# Patient Record
Sex: Male | Born: 1975 | Race: White | Hispanic: No | Marital: Married | Smoking: Never smoker
Health system: Southern US, Community
[De-identification: ages and names within clinical notes are randomized; demographics above are authoritative.]

## PROBLEM LIST (undated history)

## (undated) DIAGNOSIS — G4733 Obstructive sleep apnea (adult) (pediatric): Secondary | ICD-10-CM

## (undated) HISTORY — DX: Obstructive sleep apnea (adult) (pediatric): G47.33

---

## 2003-11-25 ENCOUNTER — Ambulatory Visit (HOSPITAL_COMMUNITY): Admission: RE | Admit: 2003-11-25 | Discharge: 2003-11-25 | Payer: Self-pay | Admitting: Internal Medicine

## 2004-11-03 ENCOUNTER — Ambulatory Visit: Payer: Self-pay | Admitting: Internal Medicine

## 2005-03-11 ENCOUNTER — Ambulatory Visit: Payer: Self-pay | Admitting: Internal Medicine

## 2005-04-13 ENCOUNTER — Ambulatory Visit: Payer: Self-pay | Admitting: Internal Medicine

## 2008-06-19 ENCOUNTER — Ambulatory Visit: Payer: Self-pay | Admitting: Internal Medicine

## 2008-06-19 DIAGNOSIS — J45909 Unspecified asthma, uncomplicated: Secondary | ICD-10-CM | POA: Insufficient documentation

## 2008-06-19 DIAGNOSIS — M940 Chondrocostal junction syndrome [Tietze]: Secondary | ICD-10-CM | POA: Insufficient documentation

## 2008-06-20 ENCOUNTER — Ambulatory Visit: Payer: Self-pay | Admitting: Internal Medicine

## 2008-06-25 ENCOUNTER — Encounter (INDEPENDENT_AMBULATORY_CARE_PROVIDER_SITE_OTHER): Payer: Self-pay | Admitting: *Deleted

## 2008-07-01 ENCOUNTER — Encounter (INDEPENDENT_AMBULATORY_CARE_PROVIDER_SITE_OTHER): Payer: Self-pay | Admitting: *Deleted

## 2008-07-01 LAB — CONVERTED CEMR LAB
ALT: 30 units/L (ref 0–53)
Alkaline Phosphatase: 56 units/L (ref 39–117)
Basophils Relative: 0.6 % (ref 0.0–3.0)
Bilirubin, Direct: 0.1 mg/dL (ref 0.0–0.3)
CO2: 27 meq/L (ref 19–32)
Cholesterol: 167 mg/dL (ref 0–200)
Eosinophils Relative: 2.5 % (ref 0.0–5.0)
GFR calc Af Amer: 100 mL/min
GFR calc non Af Amer: 82 mL/min
Glucose, Bld: 88 mg/dL (ref 70–99)
HDL: 33.5 mg/dL — ABNORMAL LOW (ref >39.0)
MCHC: 35.1 g/dL (ref 30.0–36.0)
MCV: 89.2 fL (ref 78.0–100.0)
Neutro Abs: 3.5 10*3/uL (ref 1.4–7.7)
Neutrophils Relative %: 64.4 % (ref 43.0–77.0)
RDW: 11.6 % (ref 11.5–14.6)
TSH: 2.84 microintl units/mL (ref 0.35–5.50)
Total Bilirubin: 0.8 mg/dL (ref 0.3–1.2)
Total Protein: 7 g/dL (ref 6.0–8.3)
WBC: 5.3 10*3/uL (ref 4.5–10.5)

## 2015-04-21 ENCOUNTER — Encounter (HOSPITAL_COMMUNITY): Payer: Self-pay | Admitting: *Deleted

## 2015-04-21 ENCOUNTER — Emergency Department (HOSPITAL_COMMUNITY)
Admission: EM | Admit: 2015-04-21 | Discharge: 2015-04-22 | Disposition: A | Discharge status: Home / Self Care | Payer: BLUE CROSS/BLUE SHIELD | Attending: Emergency Medicine | Admitting: Emergency Medicine

## 2015-04-21 DIAGNOSIS — W1839XA Other fall on same level, initial encounter: Secondary | ICD-10-CM | POA: Diagnosis not present

## 2015-04-21 DIAGNOSIS — S93401A Sprain of unspecified ligament of right ankle, initial encounter: Secondary | ICD-10-CM | POA: Diagnosis not present

## 2015-04-21 DIAGNOSIS — Y9389 Activity, other specified: Secondary | ICD-10-CM | POA: Insufficient documentation

## 2015-04-21 DIAGNOSIS — Y9289 Other specified places as the place of occurrence of the external cause: Secondary | ICD-10-CM | POA: Insufficient documentation

## 2015-04-21 DIAGNOSIS — Y998 Other external cause status: Secondary | ICD-10-CM | POA: Insufficient documentation

## 2015-04-21 DIAGNOSIS — S99911A Unspecified injury of right ankle, initial encounter: Secondary | ICD-10-CM | POA: Diagnosis present

## 2015-04-21 NOTE — ED Notes (Signed)
Pt was playing kickball and rolled his left ankle and heard a pop,  He got up and passed out,  He didn't hit ground his "buddies" caught him,

## 2015-04-22 ENCOUNTER — Emergency Department (HOSPITAL_COMMUNITY): Payer: BLUE CROSS/BLUE SHIELD

## 2015-04-22 ENCOUNTER — Other Ambulatory Visit (HOSPITAL_COMMUNITY): Payer: BLUE CROSS/BLUE SHIELD

## 2015-04-22 MED ORDER — IBUPROFEN 800 MG PO TABS: 800.0000 mg | ORAL_TABLET | Freq: Three times a day (TID) | ORAL | Status: DC

## 2015-04-22 MED ORDER — HYDROCODONE-ACETAMINOPHEN 5-325 MG PO TABS: 1.0000 | ORAL_TABLET | ORAL | Status: DC | PRN

## 2015-04-22 NOTE — ED Provider Notes (Signed)
CSN: [id number]     Arrival date & time 04/21/15  2310 History   First MD Initiated Contact with Patient 04/21/15 2356     Chief Complaint  Patient presents with  . Ankle Injury     (Consider location/radiation/quality/duration/timing/severity/associated sxs/prior Treatment) Patient is a 39 y.o. male presenting with ankle pain. The history is provided by the patient. No language interpreter was used.  Ankle Pain Location:  Ankle Injury: yes   Ankle location:  R ankle Associated symptoms: no back pain, no fever and no neck pain   Associated symptoms comment:  Left ankle injury playing kickball and twisting the ankle while falling. No other injury. He complains of swelling of the lateral ankle and pain with weight bearing.   No past medical history on file. No past surgical history on file. History reviewed. No pertinent family history. Social History  Substance Use Topics  . Smoking status: Never Smoker   . Smokeless tobacco: None  . Alcohol Use: Yes     Comment: occassional     Review of Systems  Constitutional: Negative for fever and chills.  Cardiovascular: Negative.  Negative for chest pain.  Gastrointestinal: Negative.  Negative for abdominal pain.  Musculoskeletal: Negative.  Negative for back pain and neck pain.       See HPI.  Skin: Negative.  Negative for wound.  Neurological: Negative.  Negative for numbness.      Allergies  Review of patient's allergies indicates no known allergies.  Home Medications   Prior to Admission medications   Not on File   BP 115/67 mmHg  Pulse 81  Temp(Src) 98 F (36.7 C) (Oral)  Resp 18  Ht 6' (1.829 m)  Wt 195 lb (88.451 kg)  BMI 26.44 kg/m2  SpO2 99% Physical Exam  Constitutional: He appears well-developed and well-nourished. No distress.  HENT:  Head: Atraumatic.  Neck: Normal range of motion.  Pulmonary/Chest: Effort normal.  Abdominal: Soft. There is no tenderness.  Musculoskeletal:  Left ankle swollen  laterally. No bony deformity. Joint stable. Left foot neurovascularly intact. No calf or lower leg tenderness proximal to ankle.  Neurological: He is alert.  Skin: Skin is warm and dry.  Psychiatric: He has a normal mood and affect.    ED Course  Procedures (including critical care time) Labs Review Labs Reviewed - No data to display  Imaging Review No results found. I have personally reviewed and evaluated these images and lab results as part of my medical decision-making.   EKG Interpretation None      MDM   Final diagnoses:  None    1. Left ankle sprain  Uncomplicated ankle sprain without fracture. ASO/crutches provided.     Charlann Lange, PA-C 04/23/15 0542  Jola Schmidt, MD 04/25/15 [phone]

## 2015-04-22 NOTE — Discharge Instructions (Signed)
Ankle Sprain °An ankle sprain is an injury to the strong, fibrous tissues (ligaments) that hold the bones of your ankle joint together.  °CAUSES °An ankle sprain is usually caused by a fall or by twisting your ankle. Ankle sprains most commonly occur when you step on the outer edge of your foot, and your ankle turns inward. People who participate in sports are more prone to these types of injuries.  °SYMPTOMS  °· Pain in your ankle. The pain may be present at rest or only when you are trying to stand or walk. °· Swelling. °· Bruising. Bruising may develop immediately or within 1 to 2 days after your injury. °· Difficulty standing or walking, particularly when turning corners or changing directions. °DIAGNOSIS  °Your caregiver will ask you details about your injury and perform a physical exam of your ankle to determine if you have an ankle sprain. During the physical exam, your caregiver will press on and apply pressure to specific areas of your foot and ankle. Your caregiver will try to move your ankle in certain ways. An X-ray exam may be done to be sure a bone was not broken or a ligament did not separate from one of the bones in your ankle (avulsion fracture).  °TREATMENT  °Certain types of braces can help stabilize your ankle. Your caregiver can make a recommendation for this. Your caregiver may recommend the use of medicine for pain. If your sprain is severe, your caregiver may refer you to a surgeon who helps to restore function to parts of your skeletal system (orthopedist) or a physical therapist. °HOME CARE INSTRUCTIONS  °· Apply ice to your injury for 1-2 days or as directed by your caregiver. Applying ice helps to reduce inflammation and pain. °· Put ice in a plastic bag. °· Place a towel between your skin and the bag. °· Leave the ice on for 15-20 minutes at a time, every 2 hours while you are awake. °· Only take over-the-counter or prescription medicines for pain, discomfort, or fever as directed by  your caregiver. °· Elevate your injured ankle above the level of your heart as much as possible for 2-3 days. °· If your caregiver recommends crutches, use them as instructed. Gradually put weight on the affected ankle. Continue to use crutches or a cane until you can walk without feeling pain in your ankle. °· If you have a plaster splint, wear the splint as directed by your caregiver. Do not rest it on anything harder than a pillow for the first 24 hours. Do not put weight on it. Do not get it wet. You may take it off to take a shower or bath. °· You may have been given an elastic bandage to wear around your ankle to provide support. If the elastic bandage is too tight (you have numbness or tingling in your foot or your foot becomes cold and blue), adjust the bandage to make it comfortable. °· If you have an air splint, you may blow more air into it or let air out to make it more comfortable. You may take your splint off at night and before taking a shower or bath. Wiggle your toes in the splint several times per day to decrease swelling. °SEEK MEDICAL CARE IF:  °· You have rapidly increasing bruising or swelling. °· Your toes feel extremely cold or you lose feeling in your foot. °· Your pain is not relieved with medicine. °SEEK IMMEDIATE MEDICAL CARE IF: °· Your toes are numb or blue. °·   You have severe pain that is increasing. °MAKE SURE YOU:  °· Understand these instructions. °· Will watch your condition. °· Will get help right away if you are not doing well or get worse. °Document Released: 07/19/2005 Document Revised: 04/12/2012 Document Reviewed: 07/31/2011 °ExitCare® Patient Information ©2015 [employer]. This information is not intended to replace advice given to you by your health care provider. Make sure you discuss any questions you have with your health care provider. °Cryotherapy °Cryotherapy means treatment with cold. Ice or gel packs can be used to reduce both pain and swelling. Ice is the most  helpful within the first 24 to 48 hours after an injury or flare-up from overusing a muscle or joint. Sprains, strains, spasms, burning pain, shooting pain, and aches can all be eased with ice. Ice can also be used when recovering from surgery. Ice is effective, has very few side effects, and is safe for most people to use. °PRECAUTIONS  °Ice is not a safe treatment option for people with: °· Raynaud phenomenon. This is a condition affecting small blood vessels in the extremities. Exposure to cold may cause your problems to return. °· Cold hypersensitivity. There are many forms of cold hypersensitivity, including: °¨ Cold urticaria. Red, itchy hives appear on the skin when the tissues begin to warm after being iced. °¨ Cold erythema. This is a red, itchy rash caused by exposure to cold. °¨ Cold hemoglobinuria. Red blood cells break down when the tissues begin to warm after being iced. The hemoglobin that carry oxygen are passed into the urine because they cannot combine with blood proteins fast enough. °· Numbness or altered sensitivity in the area being iced. °If you have any of the following conditions, do not use ice until you have discussed cryotherapy with your caregiver: °· Heart conditions, such as arrhythmia, angina, or chronic heart disease. °· High blood pressure. °· Healing wounds or open skin in the area being iced. °· Current infections. °· Rheumatoid arthritis. °· Poor circulation. °· Diabetes. °Ice slows the blood flow in the region it is applied. This is beneficial when trying to stop inflamed tissues from spreading irritating chemicals to surrounding tissues. However, if you expose your skin to cold temperatures for too long or without the proper protection, you can damage your skin or nerves. Watch for signs of skin damage due to cold. °HOME CARE INSTRUCTIONS °Follow these tips to use ice and cold packs safely. °· Place a dry or damp towel between the ice and skin. A damp towel will cool the skin  more quickly, so you may need to shorten the time that the ice is used. °· For a more rapid response, add gentle compression to the ice. °· Ice for no more than 10 to 20 minutes at a time. The bonier the area you are icing, the less time it will take to get the benefits of ice. °· Check your skin after 5 minutes to make sure there are no signs of a poor response to cold or skin damage. °· Rest 20 minutes or more between uses. °· Once your skin is numb, you can end your treatment. You can test numbness by very lightly touching your skin. The touch should be so light that you do not see the skin dimple from the pressure of your fingertip. When using ice, most people will feel these normal sensations in this order: cold, burning, aching, and numbness. °· Do not use ice on someone who cannot communicate their responses to pain,   such as small children or people with dementia. °HOW TO MAKE AN ICE PACK °Ice packs are the most common way to use ice therapy. Other methods include ice massage, ice baths, and cryosprays. Muscle creams that cause a cold, tingly feeling do not offer the same benefits that ice offers and should not be used as a substitute unless recommended by your caregiver. °To make an ice pack, do one of the following: °· Place crushed ice or a bag of frozen vegetables in a sealable plastic bag. Squeeze out the excess air. Place this bag inside another plastic bag. Slide the bag into a pillowcase or place a damp towel between your skin and the bag. °· Mix 3 parts water with 1 part rubbing alcohol. Freeze the mixture in a sealable plastic bag. When you remove the mixture from the freezer, it will be slushy. Squeeze out the excess air. Place this bag inside another plastic bag. Slide the bag into a pillowcase or place a damp towel between your skin and the bag. °SEEK MEDICAL CARE IF: °· You develop white spots on your skin. This may give the skin a blotchy (mottled) appearance. °· Your skin turns blue or  pale. °· Your skin becomes waxy or hard. °· Your swelling gets worse. °MAKE SURE YOU:  °· Understand these instructions. °· Will watch your condition. °· Will get help right away if you are not doing well or get worse. °Document Released: 03/15/2011 Document Revised: 12/03/2013 Document Reviewed: 03/15/2011 °ExitCare® Patient Information ©2015 [employer]. This information is not intended to replace advice given to you by your health care provider. Make sure you discuss any questions you have with your health care provider. ° °

## 2015-08-03 DIAGNOSIS — M109 Gout, unspecified: Secondary | ICD-10-CM

## 2015-08-03 HISTORY — DX: Gout, unspecified: M10.9

## 2017-02-16 DIAGNOSIS — M659 Synovitis and tenosynovitis, unspecified: Secondary | ICD-10-CM | POA: Diagnosis not present

## 2017-02-16 DIAGNOSIS — M109 Gout, unspecified: Secondary | ICD-10-CM | POA: Diagnosis not present

## 2017-02-16 DIAGNOSIS — M71571 Other bursitis, not elsewhere classified, right ankle and foot: Secondary | ICD-10-CM | POA: Diagnosis not present

## 2017-02-16 DIAGNOSIS — M19071 Primary osteoarthritis, right ankle and foot: Secondary | ICD-10-CM | POA: Diagnosis not present

## 2017-02-23 DIAGNOSIS — M7751 Other enthesopathy of right foot: Secondary | ICD-10-CM | POA: Diagnosis not present

## 2017-02-23 DIAGNOSIS — M659 Synovitis and tenosynovitis, unspecified: Secondary | ICD-10-CM | POA: Diagnosis not present

## 2017-02-23 DIAGNOSIS — B078 Other viral warts: Secondary | ICD-10-CM | POA: Diagnosis not present

## 2017-03-07 DIAGNOSIS — L237 Allergic contact dermatitis due to plants, except food: Secondary | ICD-10-CM | POA: Diagnosis not present

## 2017-03-17 DIAGNOSIS — H16201 Unspecified keratoconjunctivitis, right eye: Secondary | ICD-10-CM | POA: Diagnosis not present

## 2017-04-26 DIAGNOSIS — M7751 Other enthesopathy of right foot: Secondary | ICD-10-CM | POA: Diagnosis not present

## 2017-04-26 DIAGNOSIS — M659 Synovitis and tenosynovitis, unspecified: Secondary | ICD-10-CM | POA: Diagnosis not present

## 2017-04-26 DIAGNOSIS — M109 Gout, unspecified: Secondary | ICD-10-CM | POA: Diagnosis not present

## 2017-05-11 DIAGNOSIS — D2230 Melanocytic nevi of unspecified part of face: Secondary | ICD-10-CM | POA: Diagnosis not present

## 2017-05-11 DIAGNOSIS — L814 Other melanin hyperpigmentation: Secondary | ICD-10-CM | POA: Diagnosis not present

## 2017-05-11 DIAGNOSIS — B079 Viral wart, unspecified: Secondary | ICD-10-CM | POA: Diagnosis not present

## 2017-05-11 DIAGNOSIS — Z85828 Personal history of other malignant neoplasm of skin: Secondary | ICD-10-CM | POA: Diagnosis not present

## 2017-05-18 DIAGNOSIS — Z0000 Encounter for general adult medical examination without abnormal findings: Secondary | ICD-10-CM | POA: Diagnosis not present

## 2017-05-25 DIAGNOSIS — Z8709 Personal history of other diseases of the respiratory system: Secondary | ICD-10-CM | POA: Diagnosis not present

## 2017-08-05 DIAGNOSIS — J45909 Unspecified asthma, uncomplicated: Secondary | ICD-10-CM | POA: Diagnosis not present

## 2017-09-26 DIAGNOSIS — E781 Pure hyperglyceridemia: Secondary | ICD-10-CM | POA: Diagnosis not present

## 2017-10-03 DIAGNOSIS — J45909 Unspecified asthma, uncomplicated: Secondary | ICD-10-CM | POA: Diagnosis not present

## 2017-10-03 DIAGNOSIS — E781 Pure hyperglyceridemia: Secondary | ICD-10-CM | POA: Diagnosis not present

## 2017-10-20 DIAGNOSIS — M25562 Pain in left knee: Secondary | ICD-10-CM | POA: Diagnosis not present

## 2018-06-05 DIAGNOSIS — Z0000 Encounter for general adult medical examination without abnormal findings: Secondary | ICD-10-CM | POA: Diagnosis not present

## 2018-06-07 DIAGNOSIS — Z8709 Personal history of other diseases of the respiratory system: Secondary | ICD-10-CM | POA: Diagnosis not present

## 2018-06-07 DIAGNOSIS — Z0000 Encounter for general adult medical examination without abnormal findings: Secondary | ICD-10-CM | POA: Diagnosis not present

## 2018-07-17 DIAGNOSIS — T1511XA Foreign body in conjunctival sac, right eye, initial encounter: Secondary | ICD-10-CM | POA: Diagnosis not present

## 2018-07-17 DIAGNOSIS — H1131 Conjunctival hemorrhage, right eye: Secondary | ICD-10-CM | POA: Diagnosis not present

## 2020-02-05 DIAGNOSIS — Z0000 Encounter for general adult medical examination without abnormal findings: Secondary | ICD-10-CM | POA: Diagnosis not present

## 2020-02-05 DIAGNOSIS — Z1212 Encounter for screening for malignant neoplasm of rectum: Secondary | ICD-10-CM | POA: Diagnosis not present

## 2020-04-23 DIAGNOSIS — H16402 Unspecified corneal neovascularization, left eye: Secondary | ICD-10-CM | POA: Diagnosis not present

## 2020-04-23 DIAGNOSIS — H5203 Hypermetropia, bilateral: Secondary | ICD-10-CM | POA: Diagnosis not present

## 2020-04-23 DIAGNOSIS — H52203 Unspecified astigmatism, bilateral: Secondary | ICD-10-CM | POA: Diagnosis not present

## 2020-06-11 DIAGNOSIS — D485 Neoplasm of uncertain behavior of skin: Secondary | ICD-10-CM | POA: Diagnosis not present

## 2020-06-11 DIAGNOSIS — D2230 Melanocytic nevi of unspecified part of face: Secondary | ICD-10-CM | POA: Diagnosis not present

## 2020-06-11 DIAGNOSIS — D225 Melanocytic nevi of trunk: Secondary | ICD-10-CM | POA: Diagnosis not present

## 2020-06-11 DIAGNOSIS — L578 Other skin changes due to chronic exposure to nonionizing radiation: Secondary | ICD-10-CM | POA: Diagnosis not present

## 2020-06-11 DIAGNOSIS — L814 Other melanin hyperpigmentation: Secondary | ICD-10-CM | POA: Diagnosis not present

## 2020-06-11 DIAGNOSIS — Z85828 Personal history of other malignant neoplasm of skin: Secondary | ICD-10-CM | POA: Diagnosis not present

## 2020-06-11 DIAGNOSIS — L570 Actinic keratosis: Secondary | ICD-10-CM | POA: Diagnosis not present

## 2020-08-06 DIAGNOSIS — D229 Melanocytic nevi, unspecified: Secondary | ICD-10-CM | POA: Diagnosis not present

## 2020-08-06 DIAGNOSIS — D225 Melanocytic nevi of trunk: Secondary | ICD-10-CM | POA: Diagnosis not present

## 2020-09-03 DIAGNOSIS — G4719 Other hypersomnia: Secondary | ICD-10-CM | POA: Diagnosis not present

## 2020-09-03 DIAGNOSIS — R0683 Snoring: Secondary | ICD-10-CM | POA: Diagnosis not present

## 2020-12-25 DIAGNOSIS — M19072 Primary osteoarthritis, left ankle and foot: Secondary | ICD-10-CM | POA: Diagnosis not present

## 2020-12-25 DIAGNOSIS — M67372 Transient synovitis, left ankle and foot: Secondary | ICD-10-CM | POA: Diagnosis not present

## 2020-12-26 DIAGNOSIS — M67372 Transient synovitis, left ankle and foot: Secondary | ICD-10-CM | POA: Diagnosis not present

## 2021-01-09 ENCOUNTER — Encounter: Payer: Self-pay | Admitting: Internal Medicine

## 2021-02-05 DIAGNOSIS — Z0000 Encounter for general adult medical examination without abnormal findings: Secondary | ICD-10-CM | POA: Diagnosis not present

## 2021-02-10 DIAGNOSIS — R0683 Snoring: Secondary | ICD-10-CM | POA: Diagnosis not present

## 2021-02-10 DIAGNOSIS — F419 Anxiety disorder, unspecified: Secondary | ICD-10-CM | POA: Diagnosis not present

## 2021-02-10 DIAGNOSIS — E7800 Pure hypercholesterolemia, unspecified: Secondary | ICD-10-CM | POA: Diagnosis not present

## 2021-02-10 DIAGNOSIS — Z6828 Body mass index (BMI) 28.0-28.9, adult: Secondary | ICD-10-CM | POA: Diagnosis not present

## 2021-02-10 DIAGNOSIS — Z0000 Encounter for general adult medical examination without abnormal findings: Secondary | ICD-10-CM | POA: Diagnosis not present

## 2021-02-10 DIAGNOSIS — Z8709 Personal history of other diseases of the respiratory system: Secondary | ICD-10-CM | POA: Diagnosis not present

## 2021-02-11 DIAGNOSIS — R0683 Snoring: Secondary | ICD-10-CM | POA: Diagnosis not present

## 2021-02-11 DIAGNOSIS — R5383 Other fatigue: Secondary | ICD-10-CM | POA: Diagnosis not present

## 2021-02-16 ENCOUNTER — Other Ambulatory Visit: Payer: Self-pay | Admitting: Internal Medicine

## 2021-02-16 DIAGNOSIS — E7800 Pure hypercholesterolemia, unspecified: Secondary | ICD-10-CM

## 2021-02-24 DIAGNOSIS — M67372 Transient synovitis, left ankle and foot: Secondary | ICD-10-CM | POA: Diagnosis not present

## 2021-03-11 ENCOUNTER — Other Ambulatory Visit: Payer: BLUE CROSS/BLUE SHIELD

## 2021-03-18 ENCOUNTER — Ambulatory Visit (INDEPENDENT_AMBULATORY_CARE_PROVIDER_SITE_OTHER): Payer: BLUE CROSS/BLUE SHIELD | Admitting: Internal Medicine

## 2021-03-18 ENCOUNTER — Encounter: Payer: Self-pay | Admitting: Internal Medicine

## 2021-03-18 VITALS — BP 120/60 | HR 90 | Ht 72.0 in | Wt 213.2 lb

## 2021-03-18 DIAGNOSIS — Z1211 Encounter for screening for malignant neoplasm of colon: Secondary | ICD-10-CM

## 2021-03-18 DIAGNOSIS — K649 Unspecified hemorrhoids: Secondary | ICD-10-CM | POA: Diagnosis not present

## 2021-03-18 MED ORDER — HYDROCORTISONE (PERIANAL) 2.5 % EX CREA: TOPICAL_CREAM | CUTANEOUS | 1 refills | Status: AC

## 2021-03-18 NOTE — Patient Instructions (Addendum)
If you are age 45 or older, your body mass index should be between 23-30. Your Body mass index is 28.92 kg/m. If this is out of the aforementioned range listed, please consider follow up with your Primary Care Provider.  If you are age 70 or younger, your body mass index should be between 19-25. Your Body mass index is 28.92 kg/m. If this is out of the aformentioned range listed, please consider follow up with your Primary Care Provider.   __________________________________________________________  The [facility] providers would like to encourage you to use [facility] to communicate with providers for non-urgent requests or questions.  Due to long hold times on the telephone, sending your provider a message by [facility] may be a faster and more efficient way to get a response.  Please allow 48 business hours for a response.  Please remember that this is for non-urgent requests.   We have sent the following medications to your pharmacy for you to pick up at your convenience: Anusol cream  Use the squatty potty.   We Are giving you information to read on hemorrhoids.  We will put you in the system for a colonoscopy recall for age 83 per Dr Carlean Purl.  Come back and see [place] as needed.   I appreciate the opportunity to care for you. Silvano Rusk, MD, [facility]

## 2021-03-18 NOTE — Progress Notes (Signed)
Otho Rodela Hur 45 y.o. 05-Jan-1976 [id number]  Assessment & Plan:   Encounter Diagnoses  Name Primary?   Bleeding hemorrhoids Yes   Colon cancer screening     We reviewed lifestyle changes for hemorrhoid complication prevention. Squatty potty use is advised.  Handout provided. Treat current flare with hydrocortisone cream he may administer using a suppository or with the applicator tip. We discussed timing of colonoscopy and how 45 is a grade B recommendation and 50 is a grade a recommendation.  He prefers to wait until 50 to start screening colonoscopy.  Meds ordered this encounter  Medications   hydrocortisone (ANUSOL-HC) 2.5 % rectal cream    Sig: Nightly as needed    Dispense:  28 g    Refill:  1    Return to see me as needed otherwise.  If he has persistent hemorrhoidal issues we could consider banding though he has not had problems frequent enough to recommend that yet I think.  I appreciate the opportunity to care for this patient. CC: Deland Pretty, MD  Subjective:   Chief Complaint: Rectal bleeding  HPI The patient is a 45 year old white man who has noted some intermittent rectal bleeding with bright red blood on the tissue paper rarely in the last several months.  It has not been frequent but it is new and his wife encouraged him to be checked.  He thinks he has a hemorrhoid because he feels a protrusion there.  If he has a dietary indiscretion with fatty or greasy foods he may have a loose bowel movement.  This occurs a few times a year.  There is no unintentional weight loss.  He has been doing a lot more heavy lifting of late.  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  He is also employed as a Paramedic.  GI review of systems is otherwise negative as is his general review of systems. No Known Allergies Current Meds  Medication Sig    ALLOPURINOL PO Take 1 mg by mouth as needed.   hydrocortisone (ANUSOL-HC) 2.5 % rectal cream Nightly as needed   Multiple Vitamin (MULTIVITAMIN) tablet Take 1 tablet by mouth daily.   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      Former smoker, 1 alcoholic drink a day, 1 or 2 caffeinated beverages a day no tobacco or drugs at this time   family history includes Diverticulitis in his father; Heart disease in his father and mother.   Review of Systems As per HPI  Objective:   Physical Exam BP 120/60   Pulse 90   Ht 6' (1.829 m)   Wt 213 lb 4 oz (96.7 kg)   BMI 28.92 kg/m   Rectal small tag RA  DRE tight canal but NL exam otherwise  Anoscopy Gr 2 int/ext RP hemorrhoid column w/ stigmata and Gr 1 hemorrhoids otherwise  o/w

## 2021-03-25 DIAGNOSIS — L309 Dermatitis, unspecified: Secondary | ICD-10-CM | POA: Diagnosis not present

## 2021-04-15 ENCOUNTER — Ambulatory Visit
Admission: RE | Admit: 2021-04-15 | Discharge: 2021-04-15 | Disposition: A | Discharge status: Home / Self Care | Payer: No Typology Code available for payment source | Source: Ambulatory Visit | Attending: Internal Medicine | Admitting: Internal Medicine

## 2021-04-15 DIAGNOSIS — E7800 Pure hypercholesterolemia, unspecified: Secondary | ICD-10-CM

## 2021-04-23 DIAGNOSIS — I2584 Coronary atherosclerosis due to calcified coronary lesion: Secondary | ICD-10-CM | POA: Diagnosis not present

## 2021-04-23 DIAGNOSIS — Z6830 Body mass index (BMI) 30.0-30.9, adult: Secondary | ICD-10-CM | POA: Diagnosis not present

## 2021-04-23 DIAGNOSIS — I2510 Atherosclerotic heart disease of native coronary artery without angina pectoris: Secondary | ICD-10-CM | POA: Diagnosis not present

## 2021-04-28 DIAGNOSIS — G4733 Obstructive sleep apnea (adult) (pediatric): Secondary | ICD-10-CM | POA: Diagnosis not present

## 2021-04-29 DIAGNOSIS — G4733 Obstructive sleep apnea (adult) (pediatric): Secondary | ICD-10-CM | POA: Diagnosis not present

## 2021-05-06 DIAGNOSIS — L814 Other melanin hyperpigmentation: Secondary | ICD-10-CM | POA: Diagnosis not present

## 2021-05-06 DIAGNOSIS — L578 Other skin changes due to chronic exposure to nonionizing radiation: Secondary | ICD-10-CM | POA: Diagnosis not present

## 2021-05-06 DIAGNOSIS — Z85828 Personal history of other malignant neoplasm of skin: Secondary | ICD-10-CM | POA: Diagnosis not present

## 2021-05-06 DIAGNOSIS — D2230 Melanocytic nevi of unspecified part of face: Secondary | ICD-10-CM | POA: Diagnosis not present

## 2021-05-06 DIAGNOSIS — Z23 Encounter for immunization: Secondary | ICD-10-CM | POA: Diagnosis not present

## 2021-05-07 DIAGNOSIS — T148XXA Other injury of unspecified body region, initial encounter: Secondary | ICD-10-CM | POA: Diagnosis not present

## 2021-05-07 DIAGNOSIS — M549 Dorsalgia, unspecified: Secondary | ICD-10-CM | POA: Diagnosis not present

## 2021-05-20 DIAGNOSIS — I2510 Atherosclerotic heart disease of native coronary artery without angina pectoris: Secondary | ICD-10-CM | POA: Diagnosis not present

## 2021-05-27 DIAGNOSIS — H16402 Unspecified corneal neovascularization, left eye: Secondary | ICD-10-CM | POA: Diagnosis not present

## 2021-05-27 DIAGNOSIS — H52203 Unspecified astigmatism, bilateral: Secondary | ICD-10-CM | POA: Diagnosis not present

## 2021-05-27 DIAGNOSIS — H5203 Hypermetropia, bilateral: Secondary | ICD-10-CM | POA: Diagnosis not present

## 2021-06-22 DIAGNOSIS — G4733 Obstructive sleep apnea (adult) (pediatric): Secondary | ICD-10-CM | POA: Diagnosis not present

## 2021-07-22 DIAGNOSIS — G4733 Obstructive sleep apnea (adult) (pediatric): Secondary | ICD-10-CM | POA: Diagnosis not present

## 2021-07-30 DIAGNOSIS — Z20822 Contact with and (suspected) exposure to covid-19: Secondary | ICD-10-CM | POA: Diagnosis not present

## 2021-08-22 DIAGNOSIS — G4733 Obstructive sleep apnea (adult) (pediatric): Secondary | ICD-10-CM | POA: Diagnosis not present

## 2021-09-16 DIAGNOSIS — H53001 Unspecified amblyopia, right eye: Secondary | ICD-10-CM | POA: Diagnosis not present

## 2021-10-28 DIAGNOSIS — H16201 Unspecified keratoconjunctivitis, right eye: Secondary | ICD-10-CM | POA: Diagnosis not present

## 2021-11-16 DIAGNOSIS — F419 Anxiety disorder, unspecified: Secondary | ICD-10-CM | POA: Diagnosis not present

## 2021-11-26 DIAGNOSIS — J029 Acute pharyngitis, unspecified: Secondary | ICD-10-CM | POA: Diagnosis not present

## 2022-01-12 DIAGNOSIS — F419 Anxiety disorder, unspecified: Secondary | ICD-10-CM | POA: Diagnosis not present

## 2022-02-11 DIAGNOSIS — Z0000 Encounter for general adult medical examination without abnormal findings: Secondary | ICD-10-CM | POA: Diagnosis not present

## 2022-02-17 DIAGNOSIS — I2510 Atherosclerotic heart disease of native coronary artery without angina pectoris: Secondary | ICD-10-CM | POA: Diagnosis not present

## 2022-02-17 DIAGNOSIS — F419 Anxiety disorder, unspecified: Secondary | ICD-10-CM | POA: Diagnosis not present

## 2022-02-17 DIAGNOSIS — Z0000 Encounter for general adult medical examination without abnormal findings: Secondary | ICD-10-CM | POA: Diagnosis not present

## 2022-02-17 DIAGNOSIS — Z8709 Personal history of other diseases of the respiratory system: Secondary | ICD-10-CM | POA: Diagnosis not present

## 2022-03-16 DIAGNOSIS — J020 Streptococcal pharyngitis: Secondary | ICD-10-CM | POA: Diagnosis not present

## 2022-03-22 DIAGNOSIS — L404 Guttate psoriasis: Secondary | ICD-10-CM | POA: Diagnosis not present

## 2022-03-23 DIAGNOSIS — M25562 Pain in left knee: Secondary | ICD-10-CM | POA: Diagnosis not present

## 2022-03-23 DIAGNOSIS — M899 Disorder of bone, unspecified: Secondary | ICD-10-CM | POA: Diagnosis not present

## 2022-04-01 ENCOUNTER — Emergency Department (HOSPITAL_BASED_OUTPATIENT_CLINIC_OR_DEPARTMENT_OTHER)
Admission: EM | Admit: 2022-04-01 | Discharge: 2022-04-01 | Disposition: A | Discharge status: Home / Self Care | Payer: BC Managed Care – PPO | Attending: Emergency Medicine | Admitting: Emergency Medicine

## 2022-04-01 ENCOUNTER — Other Ambulatory Visit: Payer: Self-pay

## 2022-04-01 ENCOUNTER — Emergency Department (HOSPITAL_BASED_OUTPATIENT_CLINIC_OR_DEPARTMENT_OTHER): Payer: BC Managed Care – PPO | Admitting: Radiology

## 2022-04-01 ENCOUNTER — Encounter (HOSPITAL_BASED_OUTPATIENT_CLINIC_OR_DEPARTMENT_OTHER): Payer: Self-pay | Admitting: Emergency Medicine

## 2022-04-01 DIAGNOSIS — R079 Chest pain, unspecified: Secondary | ICD-10-CM | POA: Diagnosis not present

## 2022-04-01 DIAGNOSIS — S29001A Unspecified injury of muscle and tendon of front wall of thorax, initial encounter: Secondary | ICD-10-CM | POA: Diagnosis not present

## 2022-04-01 DIAGNOSIS — S0993XA Unspecified injury of face, initial encounter: Secondary | ICD-10-CM | POA: Insufficient documentation

## 2022-04-01 DIAGNOSIS — W208XXA Other cause of strike by thrown, projected or falling object, initial encounter: Secondary | ICD-10-CM | POA: Insufficient documentation

## 2022-04-01 DIAGNOSIS — S01511A Laceration without foreign body of lip, initial encounter: Secondary | ICD-10-CM | POA: Diagnosis not present

## 2022-04-01 DIAGNOSIS — S21112A Laceration without foreign body of left front wall of thorax without penetration into thoracic cavity, initial encounter: Secondary | ICD-10-CM | POA: Insufficient documentation

## 2022-04-01 NOTE — ED Provider Notes (Signed)
[facility] EMERGENCY DEPT Provider Note   CSN: [id number] Arrival date & time: 04/01/22  1909     History  Chief Complaint  Patient presents with   Facial Injury   Chest Injury    Aaron Gates is a 46 y.o. male. Patient presenting with a facial injury injury to his chest after lifting furniture and dropping on himself.  He has a small laceration to his left anterior chest.  He also has 2 wounds inside his upper and lower lip that would not stop bleeding until he got here.  He denies any difficulty breathing or chest pain.  No Other symptoms. Up to date on tetanus   Facial Injury      Home Medications Prior to Admission medications   Medication Sig Start Date End Date Taking? Authorizing Provider  ALLOPURINOL PO Take 1 mg by mouth as needed.    [provider]  hydrocortisone (ANUSOL-HC) 2.5 % rectal cream Nightly as needed 03/18/21   Gatha Mayer, MD  Multiple Vitamin (MULTIVITAMIN) tablet Take 1 tablet by mouth daily.    [provider]      Allergies    Patient has no known allergies.    Review of Systems   Review of Systems  Respiratory:  Negative for shortness of breath.   Cardiovascular:  Negative for chest pain.  Skin:  Positive for wound.  All other systems reviewed and are negative.   Physical Exam Updated Vital Signs BP 117/77   Pulse (!) 59   Temp 98.2 F (36.8 C)   Resp 16   SpO2 97%  Physical Exam Vitals and nursing note reviewed.  Constitutional:      General: He is not in acute distress.    Appearance: Normal appearance. He is well-developed. He is not ill-appearing, toxic-appearing or diaphoretic.  HENT:     Head: Normocephalic and atraumatic.     Nose: No nasal deformity.     Mouth/Throat:     Lips: Pink. No lesions.     Comments: Superficial laceration of internal lower lip. Not bleeding. Not through and through Superficial laceration of internal upper lip. Not bleeding. Not through and through   Eyes:     General: Gaze aligned appropriately. No scleral icterus.       Right eye: No discharge.        Left eye: No discharge.     Conjunctiva/sclera: Conjunctivae normal.     Right eye: Right conjunctiva is not injected. No exudate or hemorrhage.    Left eye: Left conjunctiva is not injected. No exudate or hemorrhage. Pulmonary:     Effort: Pulmonary effort is normal. No respiratory distress.  Chest:     Comments: Small 0.5 cm linear laceration to left chest. Able to visualize entirety of depth.  Skin:    General: Skin is warm and dry.  Neurological:     Mental Status: He is alert and oriented to person, place, and time.  Psychiatric:        Mood and Affect: Mood normal.        Speech: Speech normal.        Behavior: Behavior normal. Behavior is cooperative.     ED Results / Procedures / Treatments   Labs (all labs ordered are listed, but only abnormal results are displayed) Labs Reviewed - No data to display  EKG None  Radiology DG Chest 2 View  Result Date: 04/01/2022 CLINICAL DATA:  Chest pain after injury moving furniture. EXAM: CHEST -  2 VIEW COMPARISON:  None Available. FINDINGS: The heart size and mediastinal contours are within normal limits. Both lungs are clear. The visualized skeletal structures are unremarkable. IMPRESSION: No active cardiopulmonary disease. Electronically Signed   By: Marijo Conception M.D.   On: 04/01/2022 19:57    Procedures .Marland KitchenLaceration Repair  Date/Time: 04/01/2022 9:50 PM  Performed by: Adolphus Birchwood, PA-C Authorized by: Adolphus Birchwood, PA-C   Consent:    Consent obtained:  Verbal   Consent given by:  Patient   Risks, benefits, and alternatives were discussed: yes     Risks discussed:  Infection Universal protocol:    Procedure explained and questions answered to patient or proxy's satisfaction: yes     Patient identity confirmed:  Verbally with patient Anesthesia:    Anesthesia method:  None Laceration details:     Location:  Trunk   Trunk location:  L chest   Length (cm):  0.5 Pre-procedure details:    Preparation:  Imaging obtained to evaluate for foreign bodies Exploration:    Hemostasis achieved with:  Direct pressure   Imaging obtained: x-ray     Imaging outcome: foreign body not noted     Wound exploration: entire depth of wound visualized     Wound extent: no areolar tissue violation noted, no fascia violation noted, no foreign bodies/material noted, no muscle damage noted, no nerve damage noted, no tendon damage noted, no underlying fracture noted and no vascular damage noted     Contaminated: no   Treatment:    Area cleansed with:  Saline   Amount of cleaning:  Standard   Irrigation solution:  Sterile saline   Irrigation method:  Syringe Skin repair:    Repair method:  Tissue adhesive Approximation:    Approximation:  Close Repair type:    Repair type:  Simple Post-procedure details:    Dressing:  Open (no dressing)    Medications Ordered in ED Medications - No data to display  ED Course/ Medical Decision Making/ A&P                           Medical Decision Making Amount and/or Complexity of Data Reviewed Radiology: ordered.  Patient presents with a laceration to his left chest as well as 2 superficial lacerations to his internal lip after having a piece of furniture fall on him today.  The lacerations are very superficial.  We have cleaned them out and I have applied Dermabond to the chest laceration.  He had a chest x-ray that was negative for pneumothorax or any acute rib fracture.  Lip lacerations have stopped bleeding and a very superficial in the internal mouth.  I think they will heal without any sutures.  Patient declines any CT imaging was face to assess for fractures, but he does not have any EOM entrapment or any obvious deformity.  Supportive treatments provided.  Return precautions given.  Final Clinical Impression(s) / ED Diagnoses Final diagnoses:  None     Rx / DC Orders ED Discharge Orders     None         Adolphus Birchwood, PA-C 04/01/22 2151    Gareth Morgan, MD 04/02/22 0121

## 2022-04-01 NOTE — ED Triage Notes (Signed)
Moving furniture. Large pice of furniture fell and hit pt on left side of face, lip and chest. 2cm lac on left chest and some swelling.  Abrasion type injury to face/lip and some bleeding into mouth. No broken teeth. Denies sob or problems breathing. No loc, no bloodthinner. Happened about 7pm

## 2022-04-01 NOTE — Discharge Instructions (Signed)
Please keep wounds clean. Monitor for signs of infection such as redness, swelling, abnormal drainage, or fevers.

## 2022-05-08 DIAGNOSIS — R058 Other specified cough: Secondary | ICD-10-CM | POA: Diagnosis not present

## 2022-05-08 DIAGNOSIS — M79671 Pain in right foot: Secondary | ICD-10-CM | POA: Diagnosis not present

## 2022-05-08 DIAGNOSIS — M109 Gout, unspecified: Secondary | ICD-10-CM | POA: Diagnosis not present

## 2022-05-08 DIAGNOSIS — Z8739 Personal history of other diseases of the musculoskeletal system and connective tissue: Secondary | ICD-10-CM | POA: Diagnosis not present

## 2022-05-19 DIAGNOSIS — D225 Melanocytic nevi of trunk: Secondary | ICD-10-CM | POA: Diagnosis not present

## 2022-05-19 DIAGNOSIS — D2230 Melanocytic nevi of unspecified part of face: Secondary | ICD-10-CM | POA: Diagnosis not present

## 2022-05-19 DIAGNOSIS — D224 Melanocytic nevi of scalp and neck: Secondary | ICD-10-CM | POA: Diagnosis not present

## 2022-05-19 DIAGNOSIS — L814 Other melanin hyperpigmentation: Secondary | ICD-10-CM | POA: Diagnosis not present

## 2022-06-07 DIAGNOSIS — I2510 Atherosclerotic heart disease of native coronary artery without angina pectoris: Secondary | ICD-10-CM | POA: Diagnosis not present

## 2022-06-09 DIAGNOSIS — E7800 Pure hypercholesterolemia, unspecified: Secondary | ICD-10-CM | POA: Diagnosis not present

## 2022-06-14 DIAGNOSIS — H04121 Dry eye syndrome of right lacrimal gland: Secondary | ICD-10-CM | POA: Diagnosis not present

## 2022-06-23 DIAGNOSIS — H18451 Nodular corneal degeneration, right eye: Secondary | ICD-10-CM | POA: Diagnosis not present

## 2022-06-23 DIAGNOSIS — H04121 Dry eye syndrome of right lacrimal gland: Secondary | ICD-10-CM | POA: Diagnosis not present

## 2022-07-15 DIAGNOSIS — R509 Fever, unspecified: Secondary | ICD-10-CM | POA: Diagnosis not present

## 2022-07-15 DIAGNOSIS — B349 Viral infection, unspecified: Secondary | ICD-10-CM | POA: Diagnosis not present

## 2022-08-11 DIAGNOSIS — Z1211 Encounter for screening for malignant neoplasm of colon: Secondary | ICD-10-CM | POA: Diagnosis not present

## 2022-08-16 DIAGNOSIS — E7800 Pure hypercholesterolemia, unspecified: Secondary | ICD-10-CM | POA: Diagnosis not present

## 2022-08-18 DIAGNOSIS — M791 Myalgia, unspecified site: Secondary | ICD-10-CM | POA: Diagnosis not present

## 2022-08-18 DIAGNOSIS — M545 Low back pain, unspecified: Secondary | ICD-10-CM | POA: Diagnosis not present

## 2022-08-18 DIAGNOSIS — E7800 Pure hypercholesterolemia, unspecified: Secondary | ICD-10-CM | POA: Diagnosis not present

## 2022-09-21 DIAGNOSIS — Z1211 Encounter for screening for malignant neoplasm of colon: Secondary | ICD-10-CM | POA: Diagnosis not present

## 2022-09-27 IMAGING — CT CT CARDIAC CORONARY ARTERY CALCIUM SCORE
3 series · 14 of 20 positions shown, 16 images · non-contrast
Comparison: None.

CLINICAL DATA: 44-year-old Caucasian male with history of
hyperlipidemia, family history of heart disease and prior smoking
history.

EXAM:
CT CARDIAC CORONARY ARTERY CALCIUM SCORE
TECHNIQUE: Non-contrast imaging through the heart was performed using
prospective ECG gating. Image post processing was performed on an
independent workstation, allowing for quantitative analysis of the
heart and coronary arteries. Note that this exam targets the heart
and the chest was not imaged in its entirety.

[Series 2: calcium scoring 2.00 qr36 bestdiast 70% hrt calciu · axial · 0.40mm/px · z∈[+1819,+1903]mm · 4 of 70 slices shown]
[im 14/70  vessel]
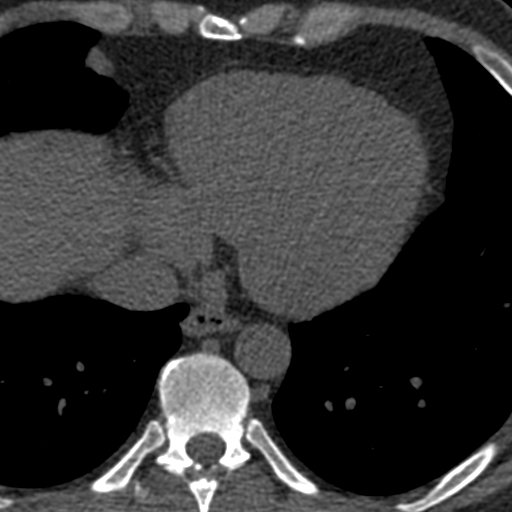
[im 28/70  vessel]
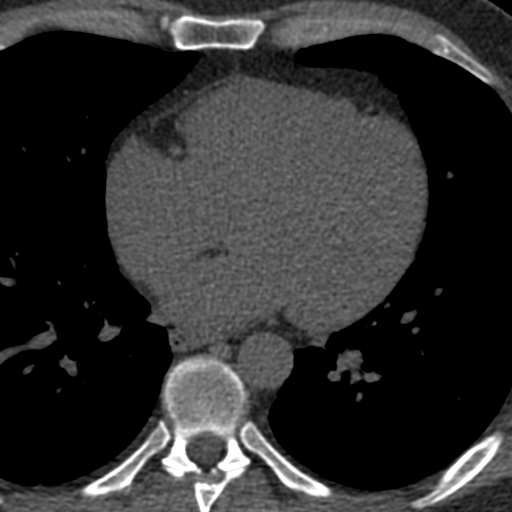
[im 42/70  vessel]
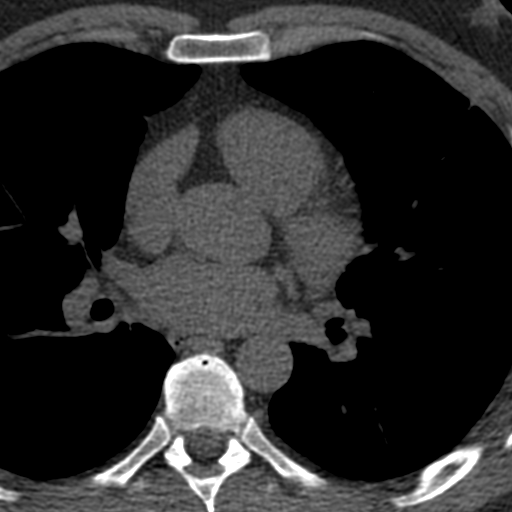
[im 56/70  vessel]
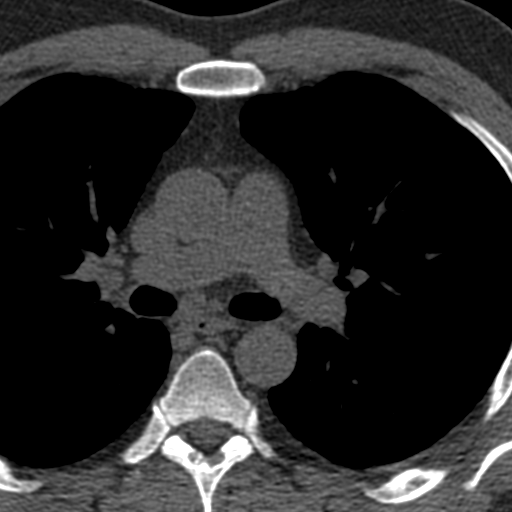

[Series 3: calcium scoring 2.00 br40 bestdiast 70% axial · axial · 0.54mm/px · z∈[+1815,+1907]mm · 5 of 70 slices shown, 7 images]
[im 12/70  vessel]
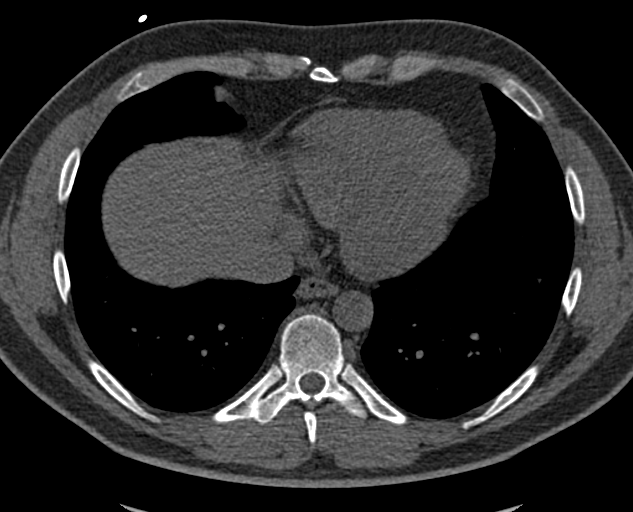
[im 12/70  lung]
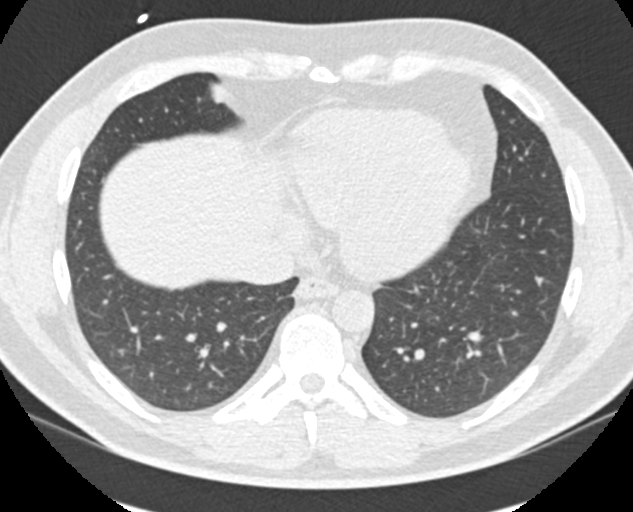
[im 24/70  vessel]
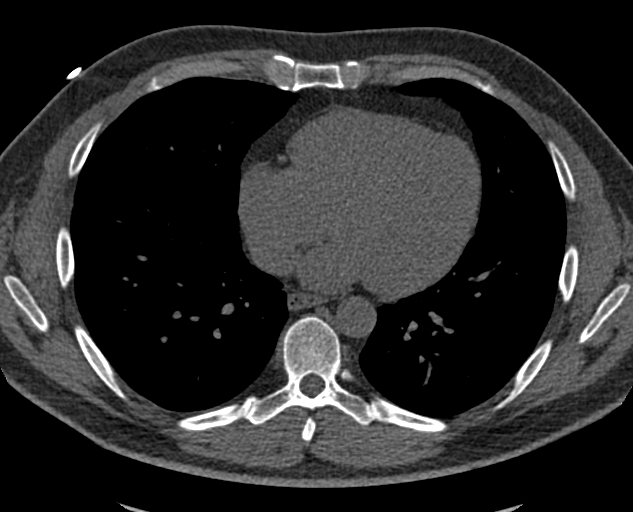
[im 35/70  vessel]
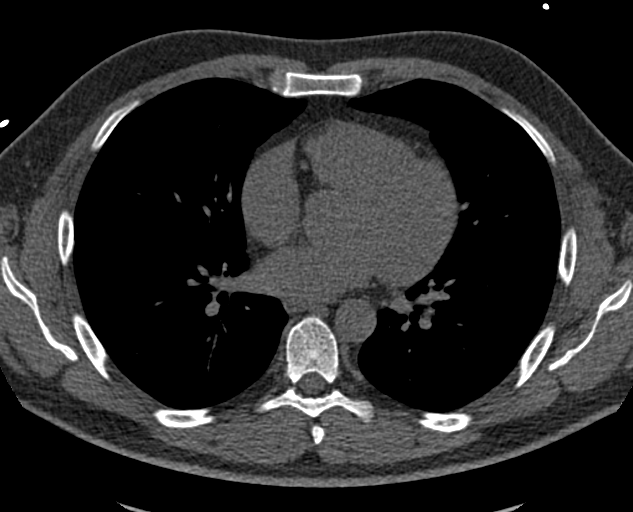
[im 47/70  vessel]
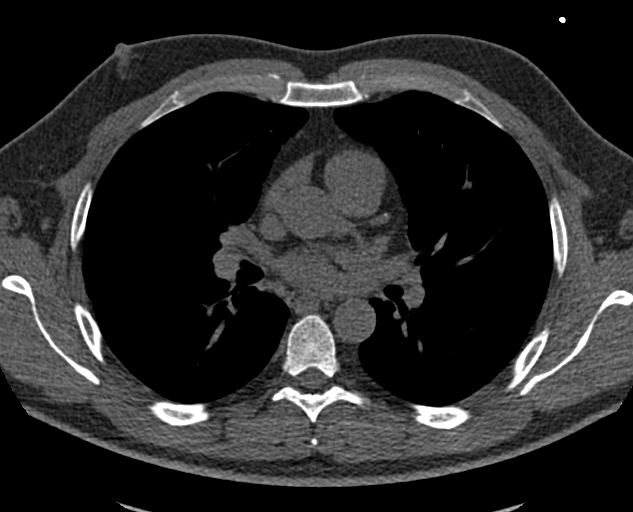
[im 58/70  vessel]
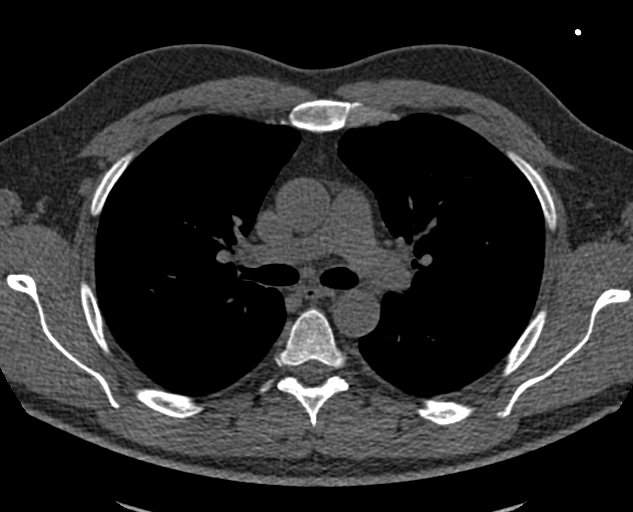
[im 58/70  lung]
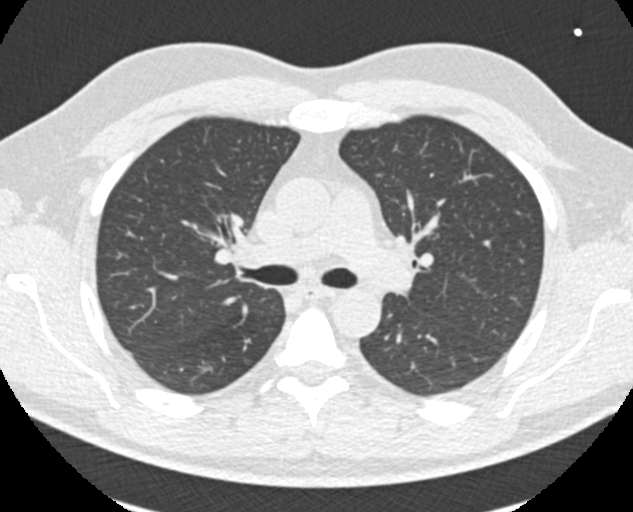

[Series 9: calcium scoring 2.00 br60 bestdiast 70% lungs · axial · 0.53mm/px · z∈[+1815,+1907]mm · 5 of 70 slices shown]
[im 12/70  vessel]
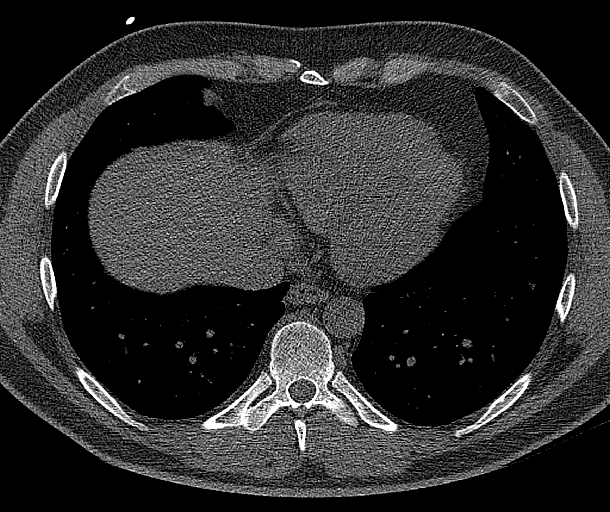
[im 24/70  vessel]
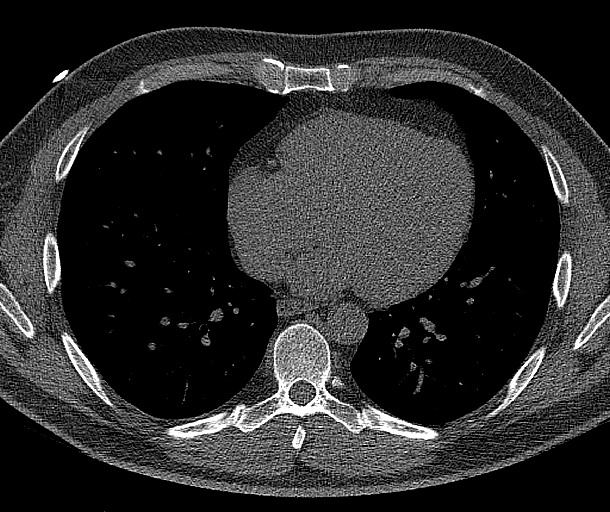
[im 35/70  vessel]
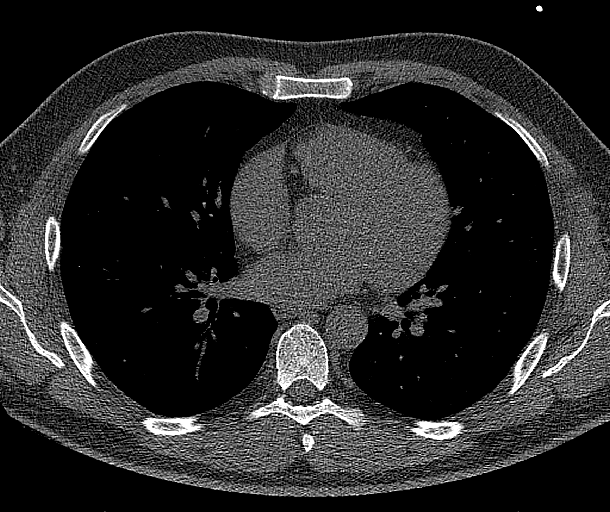
[im 47/70  vessel]
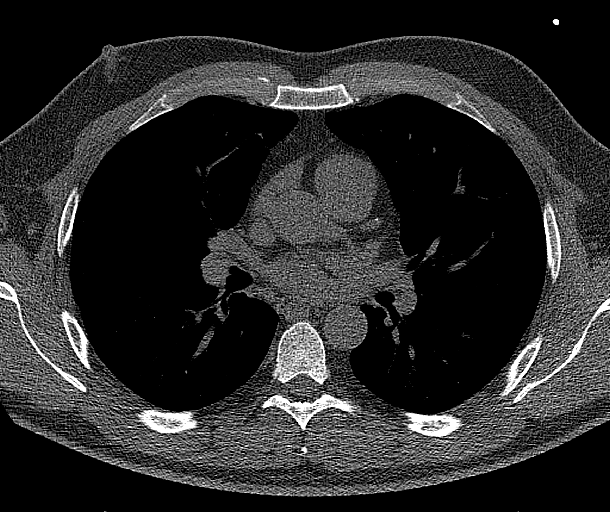
[im 58/70  vessel]
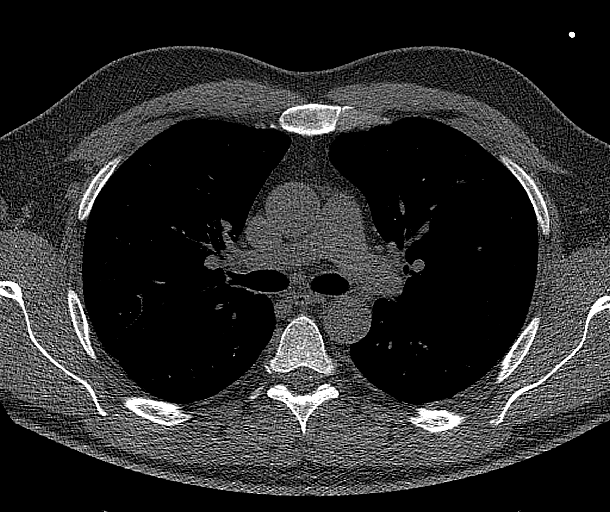

[14 of 20 positions shown; findings below may reference images not displayed]

FINDINGS: CORONARY CALCIUM SCORES:

Left Main: 0

LAD:

LCx: 0

RCA:

Total Agatston Score:

[HOSPITAL] percentile: The [HOSPITAL] does not include
patients under the age of 45. A score of 4.2 for a 45-year-old
Caucasian male is at the 79th percentile.

AORTA MEASUREMENTS:

Ascending Aorta: 28 mm

Descending Aorta: 21 mm

OTHER FINDINGS:

The heart size is within normal limits. No pericardial fluid is
identified. Visualized segments of the thoracic aorta and central
pulmonary arteries are normal in caliber. Visualized mediastinum and
hilar regions demonstrate no lymphadenopathy or masses. Visualized
lungs show no evidence of pulmonary edema, consolidation,
pneumothorax, nodule or pleural fluid. Visualized upper abdomen and
bony structures are unremarkable.
IMPRESSION: Coronary calcium score of 4.2. While percentile data is not
available for a 44-year-old, this score in a 45-year-old Caucasian
male would be at the 79th percentile.

## 2022-10-06 DIAGNOSIS — H1789 Other corneal scars and opacities: Secondary | ICD-10-CM | POA: Diagnosis not present

## 2023-01-12 DIAGNOSIS — G4733 Obstructive sleep apnea (adult) (pediatric): Secondary | ICD-10-CM | POA: Diagnosis not present

## 2023-01-27 DIAGNOSIS — G4733 Obstructive sleep apnea (adult) (pediatric): Secondary | ICD-10-CM | POA: Diagnosis not present

## 2023-02-18 DIAGNOSIS — Z0000 Encounter for general adult medical examination without abnormal findings: Secondary | ICD-10-CM | POA: Diagnosis not present

## 2023-02-23 DIAGNOSIS — G720 Drug-induced myopathy: Secondary | ICD-10-CM | POA: Diagnosis not present

## 2023-02-23 DIAGNOSIS — I2510 Atherosclerotic heart disease of native coronary artery without angina pectoris: Secondary | ICD-10-CM | POA: Diagnosis not present

## 2023-02-23 DIAGNOSIS — E7800 Pure hypercholesterolemia, unspecified: Secondary | ICD-10-CM | POA: Diagnosis not present

## 2023-02-23 DIAGNOSIS — Z0000 Encounter for general adult medical examination without abnormal findings: Secondary | ICD-10-CM | POA: Diagnosis not present

## 2023-02-23 DIAGNOSIS — Z8709 Personal history of other diseases of the respiratory system: Secondary | ICD-10-CM | POA: Diagnosis not present

## 2023-03-03 DIAGNOSIS — G4733 Obstructive sleep apnea (adult) (pediatric): Secondary | ICD-10-CM | POA: Diagnosis not present

## 2023-05-23 DIAGNOSIS — J189 Pneumonia, unspecified organism: Secondary | ICD-10-CM | POA: Diagnosis not present

## 2023-06-08 DIAGNOSIS — L578 Other skin changes due to chronic exposure to nonionizing radiation: Secondary | ICD-10-CM | POA: Diagnosis not present

## 2023-06-08 DIAGNOSIS — Z85828 Personal history of other malignant neoplasm of skin: Secondary | ICD-10-CM | POA: Diagnosis not present

## 2023-06-08 DIAGNOSIS — L814 Other melanin hyperpigmentation: Secondary | ICD-10-CM | POA: Diagnosis not present

## 2023-06-08 DIAGNOSIS — D2230 Melanocytic nevi of unspecified part of face: Secondary | ICD-10-CM | POA: Diagnosis not present

## 2023-07-05 DIAGNOSIS — E7800 Pure hypercholesterolemia, unspecified: Secondary | ICD-10-CM | POA: Diagnosis not present

## 2023-08-29 DIAGNOSIS — E7800 Pure hypercholesterolemia, unspecified: Secondary | ICD-10-CM | POA: Diagnosis not present

## 2023-08-31 DIAGNOSIS — I2510 Atherosclerotic heart disease of native coronary artery without angina pectoris: Secondary | ICD-10-CM | POA: Diagnosis not present

## 2023-09-26 DIAGNOSIS — L309 Dermatitis, unspecified: Secondary | ICD-10-CM | POA: Diagnosis not present

## 2023-10-07 DIAGNOSIS — H5203 Hypermetropia, bilateral: Secondary | ICD-10-CM | POA: Diagnosis not present

## 2023-10-07 DIAGNOSIS — H1789 Other corneal scars and opacities: Secondary | ICD-10-CM | POA: Diagnosis not present

## 2023-10-07 DIAGNOSIS — H5000 Unspecified esotropia: Secondary | ICD-10-CM | POA: Diagnosis not present

## 2023-10-07 DIAGNOSIS — H53001 Unspecified amblyopia, right eye: Secondary | ICD-10-CM | POA: Diagnosis not present

## 2023-12-13 DIAGNOSIS — M545 Low back pain, unspecified: Secondary | ICD-10-CM | POA: Diagnosis not present

## 2023-12-15 DIAGNOSIS — M9903 Segmental and somatic dysfunction of lumbar region: Secondary | ICD-10-CM | POA: Diagnosis not present

## 2023-12-15 DIAGNOSIS — M5386 Other specified dorsopathies, lumbar region: Secondary | ICD-10-CM | POA: Diagnosis not present

## 2023-12-15 DIAGNOSIS — M9902 Segmental and somatic dysfunction of thoracic region: Secondary | ICD-10-CM | POA: Diagnosis not present

## 2023-12-15 DIAGNOSIS — S39012A Strain of muscle, fascia and tendon of lower back, initial encounter: Secondary | ICD-10-CM | POA: Diagnosis not present

## 2023-12-20 DIAGNOSIS — M5386 Other specified dorsopathies, lumbar region: Secondary | ICD-10-CM | POA: Diagnosis not present

## 2023-12-20 DIAGNOSIS — M9903 Segmental and somatic dysfunction of lumbar region: Secondary | ICD-10-CM | POA: Diagnosis not present

## 2023-12-20 DIAGNOSIS — S39012A Strain of muscle, fascia and tendon of lower back, initial encounter: Secondary | ICD-10-CM | POA: Diagnosis not present

## 2023-12-20 DIAGNOSIS — M9902 Segmental and somatic dysfunction of thoracic region: Secondary | ICD-10-CM | POA: Diagnosis not present

## 2023-12-28 DIAGNOSIS — L731 Pseudofolliculitis barbae: Secondary | ICD-10-CM | POA: Diagnosis not present

## 2023-12-28 DIAGNOSIS — M5386 Other specified dorsopathies, lumbar region: Secondary | ICD-10-CM | POA: Diagnosis not present

## 2023-12-28 DIAGNOSIS — S39012A Strain of muscle, fascia and tendon of lower back, initial encounter: Secondary | ICD-10-CM | POA: Diagnosis not present

## 2023-12-28 DIAGNOSIS — M9902 Segmental and somatic dysfunction of thoracic region: Secondary | ICD-10-CM | POA: Diagnosis not present

## 2023-12-28 DIAGNOSIS — M9903 Segmental and somatic dysfunction of lumbar region: Secondary | ICD-10-CM | POA: Diagnosis not present

## 2023-12-30 DIAGNOSIS — L731 Pseudofolliculitis barbae: Secondary | ICD-10-CM | POA: Diagnosis not present

## 2023-12-30 DIAGNOSIS — K130 Diseases of lips: Secondary | ICD-10-CM | POA: Diagnosis not present

## 2024-01-18 DIAGNOSIS — M9902 Segmental and somatic dysfunction of thoracic region: Secondary | ICD-10-CM | POA: Diagnosis not present

## 2024-01-18 DIAGNOSIS — S39012A Strain of muscle, fascia and tendon of lower back, initial encounter: Secondary | ICD-10-CM | POA: Diagnosis not present

## 2024-01-18 DIAGNOSIS — M5386 Other specified dorsopathies, lumbar region: Secondary | ICD-10-CM | POA: Diagnosis not present

## 2024-01-18 DIAGNOSIS — M9903 Segmental and somatic dysfunction of lumbar region: Secondary | ICD-10-CM | POA: Diagnosis not present

## 2024-02-22 DIAGNOSIS — Z0000 Encounter for general adult medical examination without abnormal findings: Secondary | ICD-10-CM | POA: Diagnosis not present

## 2024-02-27 DIAGNOSIS — Z8709 Personal history of other diseases of the respiratory system: Secondary | ICD-10-CM | POA: Diagnosis not present

## 2024-02-27 DIAGNOSIS — Z0000 Encounter for general adult medical examination without abnormal findings: Secondary | ICD-10-CM | POA: Diagnosis not present

## 2024-02-27 DIAGNOSIS — Z8249 Family history of ischemic heart disease and other diseases of the circulatory system: Secondary | ICD-10-CM | POA: Diagnosis not present

## 2024-02-27 DIAGNOSIS — G720 Drug-induced myopathy: Secondary | ICD-10-CM | POA: Diagnosis not present

## 2024-02-27 DIAGNOSIS — I2510 Atherosclerotic heart disease of native coronary artery without angina pectoris: Secondary | ICD-10-CM | POA: Diagnosis not present

## 2024-03-06 DIAGNOSIS — E7800 Pure hypercholesterolemia, unspecified: Secondary | ICD-10-CM | POA: Diagnosis not present

## 2024-03-06 DIAGNOSIS — Z8249 Family history of ischemic heart disease and other diseases of the circulatory system: Secondary | ICD-10-CM | POA: Diagnosis not present

## 2024-03-06 DIAGNOSIS — I2584 Coronary atherosclerosis due to calcified coronary lesion: Secondary | ICD-10-CM | POA: Diagnosis not present

## 2024-03-06 DIAGNOSIS — E7841 Elevated Lipoprotein(a): Secondary | ICD-10-CM | POA: Diagnosis not present

## 2024-04-24 DIAGNOSIS — H00012 Hordeolum externum right lower eyelid: Secondary | ICD-10-CM | POA: Diagnosis not present

## 2024-06-07 DIAGNOSIS — E7800 Pure hypercholesterolemia, unspecified: Secondary | ICD-10-CM | POA: Diagnosis not present

## 2024-06-07 DIAGNOSIS — E7841 Elevated Lipoprotein(a): Secondary | ICD-10-CM | POA: Diagnosis not present

## 2024-06-12 DIAGNOSIS — S39012A Strain of muscle, fascia and tendon of lower back, initial encounter: Secondary | ICD-10-CM | POA: Diagnosis not present

## 2024-06-12 DIAGNOSIS — M9903 Segmental and somatic dysfunction of lumbar region: Secondary | ICD-10-CM | POA: Diagnosis not present

## 2024-06-12 DIAGNOSIS — M5386 Other specified dorsopathies, lumbar region: Secondary | ICD-10-CM | POA: Diagnosis not present

## 2024-06-12 DIAGNOSIS — M9902 Segmental and somatic dysfunction of thoracic region: Secondary | ICD-10-CM | POA: Diagnosis not present

## 2024-06-14 DIAGNOSIS — E7841 Elevated Lipoprotein(a): Secondary | ICD-10-CM | POA: Diagnosis not present

## 2024-06-14 DIAGNOSIS — Z8249 Family history of ischemic heart disease and other diseases of the circulatory system: Secondary | ICD-10-CM | POA: Diagnosis not present

## 2024-06-14 DIAGNOSIS — I2584 Coronary atherosclerosis due to calcified coronary lesion: Secondary | ICD-10-CM | POA: Diagnosis not present

## 2024-06-14 DIAGNOSIS — E7800 Pure hypercholesterolemia, unspecified: Secondary | ICD-10-CM | POA: Diagnosis not present

## 2024-07-23 DIAGNOSIS — K6289 Other specified diseases of anus and rectum: Secondary | ICD-10-CM | POA: Diagnosis not present

## 2024-07-23 DIAGNOSIS — K610 Anal abscess: Secondary | ICD-10-CM | POA: Diagnosis not present
